# Patient Record
Sex: Male | Born: 1995 | Race: Black or African American | Hispanic: No | Marital: Single | Smoking: Current every day smoker
Health system: Southern US, Community
[De-identification: ages and names within clinical notes are randomized; demographics above are authoritative.]

## PROBLEM LIST (undated history)

## (undated) DIAGNOSIS — J45909 Unspecified asthma, uncomplicated: Secondary | ICD-10-CM

---

## 2019-06-30 ENCOUNTER — Encounter (HOSPITAL_COMMUNITY): Payer: Self-pay

## 2019-06-30 ENCOUNTER — Ambulatory Visit (HOSPITAL_COMMUNITY)
Admission: EM | Admit: 2019-06-30 | Discharge: 2019-06-30 | Disposition: A | Discharge status: Home / Self Care | Payer: Self-pay | Attending: Emergency Medicine | Admitting: Emergency Medicine

## 2019-06-30 ENCOUNTER — Other Ambulatory Visit: Payer: Self-pay

## 2019-06-30 DIAGNOSIS — Z202 Contact with and (suspected) exposure to infections with a predominantly sexual mode of transmission: Secondary | ICD-10-CM | POA: Insufficient documentation

## 2019-06-30 MED ORDER — AZITHROMYCIN 250 MG PO TABS
1000.0000 mg | ORAL_TABLET | Freq: Once | ORAL | Status: AC
  Administered 2019-06-30: 18:00:00 1000 mg via ORAL

## 2019-06-30 MED ORDER — CEFTRIAXONE SODIUM 250 MG IJ SOLR
250.0000 mg | Freq: Once | INTRAMUSCULAR | Status: AC
  Administered 2019-06-30: 18:00:00 250 mg via INTRAMUSCULAR

## 2019-06-30 MED ORDER — LIDOCAINE HCL (PF) 1 % IJ SOLN
INTRAMUSCULAR | Status: AC
  Filled 2019-06-30: qty 2

## 2019-06-30 MED ORDER — CEFTRIAXONE SODIUM 250 MG IJ SOLR
INTRAMUSCULAR | Status: AC
  Filled 2019-06-30: qty 250

## 2019-06-30 MED ORDER — AZITHROMYCIN 250 MG PO TABS
ORAL_TABLET | ORAL | Status: AC
  Filled 2019-06-30: qty 4

## 2019-06-30 NOTE — Discharge Instructions (Addendum)
You were treated today with 2 antibiotics, Rocephin and Zithromax.    Do not have sex for 7 days.    Your STD tests are pending.  If your test results are positive, we will call you.  You may need additional treatment and your partner may also need treatment.

## 2019-06-30 NOTE — ED Provider Notes (Signed)
[facility]    CSN: [id number] Arrival date & time: 06/30/19  1538      History   Chief Complaint Chief Complaint  Patient presents with  . Exposure to STD    HPI Sandi RavelingZion Riggio is a 23 y.o. male.   Patient presents with request for STD testing and treatment.  He states he recently found out that his partner tested positive for gonorrhea.  He denies penile discharge, testicular pain, abdominal pain, dysuria, fever, rash, or other symptoms.  The history is provided by the patient.    History reviewed. No pertinent past medical history.  There are no active problems to display for this patient.   History reviewed. No pertinent surgical history.     Home Medications    Prior to Admission medications   Not on File    Family History History reviewed. No pertinent family history.  Social History Social History   Tobacco Use  . Smoking status: Current Every Day Smoker  . Smokeless tobacco: Current User  Substance Use Topics  . Alcohol use: Not on file  . Drug use: Not on file     Allergies   Patient has no known allergies.   Review of Systems Review of Systems  Constitutional: Negative for chills and fever.  HENT: Negative for ear pain and sore throat.   Eyes: Negative for pain and visual disturbance.  Respiratory: Negative for cough and shortness of breath.   Cardiovascular: Negative for chest pain and palpitations.  Gastrointestinal: Negative for abdominal pain and vomiting.  Genitourinary: Negative for discharge, dysuria, flank pain, hematuria and testicular pain.  Musculoskeletal: Negative for arthralgias and back pain.  Skin: Negative for color change and rash.  Neurological: Negative for seizures and syncope.  All other systems reviewed and are negative.    Physical Exam Triage Vital Signs ED Triage Vitals [06/30/19 1643]  Enc Vitals Group     BP (!) 129/93     Pulse Rate 71     Resp 16     Temp 98.5 F (36.9 C)     Temp Source  Oral     SpO2 99 %     Weight      Height      Head Circumference      Peak Flow      Pain Score 0     Pain Loc      Pain Edu?      Excl. in GC?    No data found.  Updated Vital Signs BP (!) 129/93 (BP Location: Left Arm)   Pulse 71   Temp 98.5 F (36.9 C) (Oral)   Resp 16   SpO2 99%   Visual Acuity Right Eye Distance:   Left Eye Distance:   Bilateral Distance:    Right Eye Near:   Left Eye Near:    Bilateral Near:     Physical Exam Vitals signs and nursing note reviewed.  Constitutional:      Appearance: He is well-developed.  HENT:     Head: Normocephalic and atraumatic.  Eyes:     Conjunctiva/sclera: Conjunctivae normal.  Neck:     Musculoskeletal: Neck supple.  Cardiovascular:     Rate and Rhythm: Normal rate and regular rhythm.     Heart sounds: No murmur.  Pulmonary:     Effort: Pulmonary effort is normal. No respiratory distress.     Breath sounds: Normal breath sounds.  Abdominal:     Palpations: Abdomen is soft.  Tenderness: There is no abdominal tenderness. There is no right CVA tenderness, left CVA tenderness, guarding or rebound.  Genitourinary:    Penis: Normal.      Scrotum/Testes: Normal.  Skin:    General: Skin is warm and dry.     Findings: No lesion or rash.  Neurological:     Mental Status: He is alert.      UC Treatments / Results  Labs (all labs ordered are listed, but only abnormal results are displayed) Labs Reviewed  RPR  HIV ANTIBODY (ROUTINE TESTING W REFLEX)  CYTOLOGY, (ORAL, ANAL, URETHRAL) ANCILLARY ONLY    EKG   Radiology No results found.  Procedures Procedures (including critical care time)  Medications Ordered in UC Medications  azithromycin (ZITHROMAX) tablet 1,000 mg (has no administration in time range)  cefTRIAXone (ROCEPHIN) injection 250 mg (has no administration in time range)    Initial Impression / Assessment and Plan / UC Course  I have reviewed the triage vital signs and the nursing  notes.  Pertinent labs & imaging results that were available during my care of the patient were reviewed by me and considered in my medical decision making (see chart for details).    Possible exposure to STD.  Patient is asymptomatic at this time.  Urethral swab for STDs pending; HIV and RPR pending.  Treated with Rocephin and Zithromax.  Instructed patient to abstain from sex for 7 days.  Discussed with patient that we will call him if his test results are positive.  Discussed that he may need additional treatment at that time and his partner may also.  Patient agrees with plan of care.     Final Clinical Impressions(s) / UC Diagnoses   Final diagnoses:  Possible exposure to STD     Discharge Instructions     You were treated today with 2 antibiotics, Rocephin and Zithromax.    Do not have sex for 7 days.    Your STD tests are pending.  If your test results are positive, we will call you.  You may need additional treatment and your partner may also need treatment.           ED Prescriptions    None     Controlled Substance Prescriptions [place] Controlled Substance Registry consulted? Not Applicable   Sharion Balloon, NP 06/30/19 1710

## 2019-06-30 NOTE — ED Triage Notes (Addendum)
Pt present he has been exposed to an STD, pt partner recently informed him that he tested positive for Gonorrhea. Pt would like to get a full panel of STD  Screening.  Pt denies any symptoms of a STD

## 2019-07-01 LAB — RPR: RPR Ser Ql: NONREACTIVE

## 2019-07-02 LAB — CYTOLOGY, (ORAL, ANAL, URETHRAL) ANCILLARY ONLY
Chlamydia: NEGATIVE
Neisseria Gonorrhea: NEGATIVE
Trichomonas: NEGATIVE

## 2019-07-02 LAB — HIV ANTIBODY (ROUTINE TESTING W REFLEX): HIV Screen 4th Generation wRfx: NONREACTIVE

## 2020-04-21 ENCOUNTER — Other Ambulatory Visit: Payer: Self-pay

## 2020-04-21 ENCOUNTER — Encounter: Payer: Self-pay | Admitting: Emergency Medicine

## 2020-04-21 ENCOUNTER — Ambulatory Visit
Admission: EM | Admit: 2020-04-21 | Discharge: 2020-04-21 | Disposition: A | Discharge status: Home / Self Care | Payer: Self-pay

## 2020-04-21 DIAGNOSIS — S01512A Laceration without foreign body of oral cavity, initial encounter: Secondary | ICD-10-CM

## 2020-04-21 HISTORY — DX: Unspecified asthma, uncomplicated: J45.909

## 2020-04-21 MED ORDER — CHLORHEXIDINE GLUCONATE 0.12 % MT SOLN: 15.0000 mL | Freq: Two times a day (BID) | OROMUCOSAL | 0 refills | Status: AC

## 2020-04-21 NOTE — Discharge Instructions (Signed)
No alarming signs on exam. Peridex as directed. Avoid further injury to the area. Ice compress. Ibuprofen 600-800mg  three times a day for pain and swelling. Follow up with PCP/ENT for further evaluation if symptoms not improving.
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
## 2020-04-21 NOTE — ED Notes (Signed)
Patient able to ambulate independently  

## 2020-07-07 ENCOUNTER — Encounter (HOSPITAL_COMMUNITY): Payer: Self-pay | Admitting: Obstetrics and Gynecology

## 2020-07-07 ENCOUNTER — Other Ambulatory Visit: Payer: Self-pay

## 2020-07-07 ENCOUNTER — Emergency Department (HOSPITAL_COMMUNITY)
Admission: EM | Admit: 2020-07-07 | Discharge: 2020-07-07 | Disposition: A | Discharge status: Home / Self Care | Payer: HRSA Program | Attending: Emergency Medicine | Admitting: Emergency Medicine

## 2020-07-07 DIAGNOSIS — Z79899 Other long term (current) drug therapy: Secondary | ICD-10-CM | POA: Diagnosis not present

## 2020-07-07 DIAGNOSIS — F17210 Nicotine dependence, cigarettes, uncomplicated: Secondary | ICD-10-CM | POA: Insufficient documentation

## 2020-07-07 DIAGNOSIS — U071 COVID-19: Secondary | ICD-10-CM | POA: Insufficient documentation

## 2020-07-07 DIAGNOSIS — J45909 Unspecified asthma, uncomplicated: Secondary | ICD-10-CM | POA: Diagnosis not present

## 2020-07-07 DIAGNOSIS — R05 Cough: Secondary | ICD-10-CM | POA: Diagnosis present

## 2020-07-07 LAB — SARS CORONAVIRUS 2 BY RT PCR (HOSPITAL ORDER, PERFORMED IN ~~LOC~~ HOSPITAL LAB): SARS Coronavirus 2: POSITIVE — AB

## 2020-07-07 MED ORDER — ALBUTEROL SULFATE HFA 108 (90 BASE) MCG/ACT IN AERS
2.0000 | INHALATION_SPRAY | RESPIRATORY_TRACT | Status: DC | PRN
  Administered 2020-07-07: 2 via RESPIRATORY_TRACT
  Filled 2020-07-07: qty 6.7

## 2020-07-07 NOTE — ED Notes (Signed)
RN attempted to call patient x3 via phone number left and was unable to reach patient. Patient discharged from system

## 2020-07-07 NOTE — ED Provider Notes (Signed)
[facility]ERGENCY DEPT Provider Note   CSN: [id number] Arrival date & time: 07/07/20  1851     History Chief Complaint  Patient presents with  . Covid Exposure    Gabriel Campbell is a 24 y.o. male.  HPI Patient has history of asthma.  States he is not on any medications.  Was brought in for upper respiratory type symptoms.  Coughing.  Some chills.  Has had symptoms for around 6 days.  States he has a roommate that has Covid.  Patient did not get the vaccines.  Has not lost sense of taste.  Some fatigue but otherwise able to do his normal daily activities.    Past Medical History:  Diagnosis Date  . Asthma     There are no problems to display for this patient.   No past surgical history on file.     Family History  Problem Relation Age of Onset  . Hypertension Mother     Social History   Tobacco Use  . Smoking status: Current Every Day Smoker    Packs/day: 0.25    Years: 2.00    Pack years: 0.50    Types: Cigarettes  . Smokeless tobacco: Never Used  Substance Use Topics  . Alcohol use: Not Currently  . Drug use: Yes    Types: Marijuana    Home Medications Prior to Admission medications   Medication Sig Start Date End Date Taking? Authorizing Provider  albuterol (VENTOLIN HFA) 108 (90 Base) MCG/ACT inhaler Inhale into the lungs every 6 (six) hours as needed for wheezing or shortness of breath.    [provider]  chlorhexidine (PERIDEX) 0.12 % solution Use as directed 15 mLs in the mouth or throat 2 (two) times daily. 04/21/20   Belinda Fisher, PA-C    Allergies    Patient has no known allergies.  Review of Systems   Review of Systems  Constitutional: Positive for fatigue and fever. Negative for appetite change.  HENT: Negative for congestion.   Respiratory: Positive for cough and shortness of breath.   Cardiovascular: Negative for chest pain.  Gastrointestinal: Negative for abdominal pain.  Genitourinary: Negative for flank  pain.  Musculoskeletal: Negative for back pain.  Skin: Negative for rash.  Neurological: Negative for weakness.  Psychiatric/Behavioral: Negative for confusion.    Physical Exam Updated Vital Signs BP 115/74 (BP Location: Left Arm)   Pulse 70   Temp 98.9 F (37.2 C) (Oral)   Resp 18   Ht 6' (1.829 m)   Wt 68 kg   SpO2 99%   BMI 20.34 kg/m   Physical Exam Vitals and nursing note reviewed.  HENT:     Head: Normocephalic.  Eyes:     Pupils: Pupils are equal, round, and reactive to light.  Cardiovascular:     Rate and Rhythm: Normal rate and regular rhythm.  Pulmonary:     Comments: Mildly harsh breath sounds without focal rales or rhonchi. Abdominal:     Tenderness: There is no abdominal tenderness.  Musculoskeletal:        General: No tenderness.     Cervical back: Neck supple.     Right lower leg: No edema.     Left lower leg: No edema.  Skin:    General: Skin is warm.     Capillary Refill: Capillary refill takes less than 2 seconds.  Neurological:     Mental Status: He is alert and oriented to person, place, and time.  Motor: No weakness.     ED Results / Procedures / Treatments   Labs (all labs ordered are listed, but only abnormal results are displayed) Labs Reviewed  SARS CORONAVIRUS 2 BY RT PCR (HOSPITAL ORDER, PERFORMED IN [facility]) - Abnormal; Notable for the following components:      Result Value   SARS Coronavirus 2 POSITIVE (*)    All other components within normal limits    EKG None  Radiology No results found.  Procedures Procedures (including critical care time)  Medications Ordered in ED Medications  albuterol (VENTOLIN HFA) 108 (90 Base) MCG/ACT inhaler 2 puff (2 puffs Inhalation Given 07/07/20 2252)    ED Course  I have reviewed the triage vital signs and the nursing notes.  Pertinent labs & imaging results that were available during my care of the patient were reviewed by me and considered in my medical  decision making (see chart for details).    MDM Rules/Calculators/A&P                          Patient with URI symptoms.  Well-appearing.  Positive Covid test.  Not hypoxic.  Does have history of asthma.  Given inhaler here.  Can follow-up with the Covid clinic for any further needs.  Potentially candidate for monoclonal antibodies with history of asthma. Final Clinical Impression(s) / ED Diagnoses Final diagnoses:  COVID-19 virus infection    Rx / DC Orders ED Discharge Orders    None       Benjiman Core, MD 07/07/20 2314

## 2020-07-07 NOTE — ED Triage Notes (Signed)
Patient reports to the ER for COVID exposure

## 2020-07-08 ENCOUNTER — Telehealth (HOSPITAL_COMMUNITY): Payer: Self-pay | Admitting: Adult Health

## 2020-07-08 NOTE — Telephone Encounter (Signed)
Called and LMOM regarding monoclonal antibody treatment for COVID 19 given to those who are at risk for complications and/or hospitalization of the virus.  Patient meets criteria based on: social risk  Call back number given: [phone]  My chart message: sent Text message: sent  Lillard Anes, NP

## 2021-08-23 ENCOUNTER — Encounter (HOSPITAL_COMMUNITY): Payer: Self-pay | Admitting: Emergency Medicine

## 2021-08-23 ENCOUNTER — Other Ambulatory Visit: Payer: Self-pay

## 2021-08-23 ENCOUNTER — Ambulatory Visit (HOSPITAL_COMMUNITY)
Admission: EM | Admit: 2021-08-23 | Discharge: 2021-08-23 | Disposition: A | Discharge status: Home / Self Care | Payer: Self-pay | Attending: Family Medicine | Admitting: Family Medicine

## 2021-08-23 DIAGNOSIS — M546 Pain in thoracic spine: Secondary | ICD-10-CM

## 2021-08-23 DIAGNOSIS — S161XXA Strain of muscle, fascia and tendon at neck level, initial encounter: Secondary | ICD-10-CM

## 2021-08-23 MED ORDER — CYCLOBENZAPRINE HCL 10 MG PO TABS: 10.0000 mg | ORAL_TABLET | Freq: Three times a day (TID) | ORAL | 0 refills | Status: AC | PRN

## 2021-08-23 MED ORDER — NAPROXEN 500 MG PO TABS: 500.0000 mg | ORAL_TABLET | Freq: Two times a day (BID) | ORAL | 0 refills | Status: AC
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
## 2021-12-29 ENCOUNTER — Other Ambulatory Visit (HOSPITAL_COMMUNITY): Payer: Self-pay

## 2021-12-29 ENCOUNTER — Other Ambulatory Visit: Payer: Self-pay

## 2021-12-29 ENCOUNTER — Ambulatory Visit: Payer: Self-pay | Admitting: Pharmacist

## 2022-01-25 ENCOUNTER — Encounter (HOSPITAL_COMMUNITY): Payer: Self-pay | Admitting: Emergency Medicine

## 2022-01-25 ENCOUNTER — Emergency Department (HOSPITAL_COMMUNITY): Payer: Self-pay

## 2022-01-25 ENCOUNTER — Emergency Department (HOSPITAL_COMMUNITY)
Admission: EM | Admit: 2022-01-25 | Discharge: 2022-01-25 | Disposition: A | Discharge status: Home / Self Care | Payer: Self-pay | Attending: Emergency Medicine | Admitting: Emergency Medicine

## 2022-01-25 DIAGNOSIS — S60511A Abrasion of right hand, initial encounter: Secondary | ICD-10-CM | POA: Insufficient documentation

## 2022-01-25 DIAGNOSIS — Z23 Encounter for immunization: Secondary | ICD-10-CM | POA: Insufficient documentation

## 2022-01-25 DIAGNOSIS — S20212A Contusion of left front wall of thorax, initial encounter: Secondary | ICD-10-CM | POA: Insufficient documentation

## 2022-01-25 DIAGNOSIS — S01511A Laceration without foreign body of lip, initial encounter: Secondary | ICD-10-CM | POA: Insufficient documentation

## 2022-01-25 DIAGNOSIS — S60512A Abrasion of left hand, initial encounter: Secondary | ICD-10-CM | POA: Insufficient documentation

## 2022-01-25 DIAGNOSIS — S60519A Abrasion of unspecified hand, initial encounter: Secondary | ICD-10-CM

## 2022-01-25 MED ORDER — OXYCODONE-ACETAMINOPHEN 5-325 MG PO TABS
1.0000 | ORAL_TABLET | Freq: Once | ORAL | Status: AC
  Administered 2022-01-25: 1 via ORAL
  Filled 2022-01-25: qty 1

## 2022-01-25 MED ORDER — TETANUS-DIPHTH-ACELL PERTUSSIS 5-2.5-18.5 LF-MCG/0.5 IM SUSY
0.5000 mL | PREFILLED_SYRINGE | Freq: Once | INTRAMUSCULAR | Status: AC
  Administered 2022-01-25: 0.5 mL via INTRAMUSCULAR
  Filled 2022-01-25: qty 0.5

## 2022-01-25 MED ORDER — LIDOCAINE-EPINEPHRINE (PF) 2 %-1:200000 IJ SOLN
2.0000 mL | Freq: Once | INTRAMUSCULAR | Status: AC
  Administered 2022-01-25: 2 mL
  Filled 2022-01-25: qty 20

## 2022-01-25 NOTE — ED Notes (Signed)
ED Provider at bedside. 
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
## 2022-01-25 NOTE — ED Provider Notes (Signed)
?[facility] DEPT ?Provider Note ? ? ?CSN: [id number] ?Arrival date & time: 01/25/22  1332 ? ?  ? ?History ? ?Chief Complaint  ?Patient presents with  ? Rib Injury  ? ? ?Gabriel Campbell is a 26 y.o. male. ? ?Patient arrives in custody of [employer]. Patient reportedly resisting arrest, ended up being taken to the ground and landing on left lateral ribs. Patient complaining of pain, with tenderness of the chest wall. No crepitus noted. Lungs CTA bilaterally. ? ?The history is provided by the patient. No language interpreter was used.  ?Chest Pain ?Pain location:  L lateral chest ?Pain quality: sharp and stabbing   ?Pain radiates to:  Does not radiate ?Pain severity:  Severe ?Onset quality:  Sudden ?Chronicity:  New ?Context: trauma   ?Ineffective treatments:  None tried ?Associated symptoms: no shortness of breath   ? ?  ? ?Home Medications ?Prior to Admission medications   ?Medication Sig Start Date End Date Taking? Authorizing Provider  ?albuterol (VENTOLIN HFA) 108 (90 Base) MCG/ACT inhaler Inhale into the lungs every 6 (six) hours as needed for wheezing or shortness of breath.    [provider]  ?chlorhexidine (PERIDEX) 0.12 % solution Use as directed 15 mLs in the mouth or throat 2 (two) times daily. 04/21/20   Tasia Catchings, Amy V, PA-C  ?cyclobenzaprine (FLEXERIL) 10 MG tablet Take 1 tablet (10 mg total) by mouth 3 (three) times daily as needed for muscle spasms. Do not drink alcohol or drive while taking this medication.  May cause drowsiness. 08/23/21   Volney American, PA-C  ?naproxen (NAPROSYN) 500 MG tablet Take 1 tablet (500 mg total) by mouth 2 (two) times daily. 08/23/21   Volney American, PA-C  ?   ? ?Allergies    ?Patient has no known allergies.   ? ?Review of Systems   ?Review of Systems  ?Respiratory:  Negative for shortness of breath.   ?Cardiovascular:  Positive for chest pain.  ?All other systems reviewed and are negative. ? ?Physical Exam ?Updated Vital Signs ?BP 121/79  (BP Location: Right Arm)   Pulse (!) 102   Temp 98 ?F (36.7 ?C) (Oral)   Resp 16   SpO2 94%  ?Physical Exam ?Vitals and nursing note reviewed.  ?Constitutional:   ?   Appearance: Normal appearance.  ?HENT:  ?   Head: Atraumatic.  ?   Nose: Nose normal.  ?   Mouth/Throat:  ? ?   Comments: Small laceration below  left lateral aspect of lip ?Eyes:  ?   Conjunctiva/sclera: Conjunctivae normal.  ?Cardiovascular:  ?   Rate and Rhythm: Normal rate and regular rhythm.  ?Pulmonary:  ?   Effort: Pulmonary effort is normal.  ?   Breath sounds: Normal breath sounds.  ?Chest:  ?   Chest wall: Tenderness present.  ?Abdominal:  ?   Palpations: Abdomen is soft.  ?Musculoskeletal:     ?   General: Normal range of motion.  ?   Cervical back: Normal range of motion.  ?Skin: ?   General: Skin is warm and dry.  ?   Comments: Superficial abrasions to palms of both hands  ?Neurological:  ?   Mental Status: He is alert and oriented to person, place, and time.  ?Psychiatric:     ?   Attention and Perception: Attention normal.     ?   Mood and Affect: Affect is angry.     ?   Speech: Speech normal.     ?  Behavior: Behavior is cooperative.  ? ? ?ED Results / Procedures / Treatments   ?Labs ?(all labs ordered are listed, but only abnormal results are displayed) ?Labs Reviewed - No data to display ? ?EKG ?None ? ?Radiology ?DG Ribs Unilateral W/Chest Left ? ?Result Date: 01/25/2022 ?CLINICAL DATA:  Injury.  Left chest wall pain EXAM: LEFT RIBS AND CHEST - 3+ VIEW COMPARISON:  None. FINDINGS: No fracture or other bone lesions are seen involving the ribs. There is no evidence of pneumothorax or pleural effusion. Both lungs are clear. Heart size and mediastinal contours are within normal limits. IMPRESSION: Negative. Electronically Signed   By: Franchot Gallo M.D.   On: 01/25/2022 14:44   ? ?Procedures ?Marland Kitchen.Laceration Repair ? ?Date/Time: 01/25/2022 3:24 PM ?Performed by: Etta Quill, NP ?Authorized by: Etta Quill, NP  ? ?Consent:  ?  Consent  obtained:  Verbal ?  Risks discussed:  Infection, pain and poor cosmetic result ?Universal protocol:  ?  Procedure explained and questions answered to patient or proxy's satisfaction: yes   ?  Patient identity confirmed:  Verbally with patient ?Anesthesia:  ?  Anesthesia method:  Local infiltration ?Laceration details:  ?  Location:  Lip ?  Lip location:  Lower exterior lip ?  Length (cm):  1 ?Pre-procedure details:  ?  Preparation:  Patient was prepped and draped in usual sterile fashion ?Exploration:  ?  Hemostasis achieved with:  Direct pressure ?  Contaminated: no   ?Treatment:  ?  Area cleansed with:  Povidone-iodine and saline ?  Amount of cleaning:  Standard ?  Irrigation method:  Syringe ?  Debridement:  None ?  Undermining:  None ?Skin repair:  ?  Repair method:  Sutures ?  Suture size:  5-0 ?  Suture technique:  Simple interrupted ?  Number of sutures:  2 ?Repair type:  ?  Repair type:  Simple ?Post-procedure details:  ?  Dressing:  Open (no dressing) ?  Procedure completion:  Tolerated  ? ? ?Medications Ordered in ED ?Medications  ?oxyCODONE-acetaminophen (PERCOCET/ROXICET) 5-325 MG per tablet 1 tablet (has no administration in time range)  ? ? ?ED Course/ Medical Decision Making/ A&P ?  ?                        ?Medical Decision Making ?Amount and/or Complexity of Data Reviewed ?Radiology: ordered. ? ?Risk ?Prescription drug management. ? ? ?Tetanus updated in ED. Laceration occurred < 12 hours prior to repair. Discussed laceration care with pt and answered questions. Pt to f-u for suture removal in 5-7 days and wound check sooner should there be signs of dehiscence or infection. Pt is hemodynamically stable with no other complaints prior to dc.  ? ?Given workup and exam, no indication of intrathoracic or intra abdominal trauma. No rib fractures identified on chest film. Care instructions and return precautions provided. ?   ? ? ? ? ? ? ? ?Final Clinical Impression(s) / ED Diagnoses ?Final diagnoses:   ?Contusion of left chest wall, initial encounter  ?Laceration of lower lip, initial encounter  ?Abrasion of palm of hand, unspecified laterality, initial encounter  ? ? ?Rx / DC Orders ?ED Discharge Orders   ? ? None  ? ?  ? ? ?  ?Etta Quill, NP ?01/25/22 1615 ? ?  ?Lorelle Gibbs, DO ?01/26/22 [id number] ? ?

## 2022-01-25 NOTE — Discharge Instructions (Addendum)
Please refer to the attached instructions. Be sure to perform deep breathing exercises several times per day. Splint with a pillow as needed during the exercises. Tylenol or ibuprofen for discomfort. Keep all wounds clean and dry. Suture removal in 5-7 days. ?

## 2022-01-25 NOTE — ED Notes (Addendum)
Pt requests EDP to exam small laceration to L lower lip. ?

## 2022-01-25 NOTE — ED Triage Notes (Signed)
Per EMS-patient was apprehended by police and throne to ground-complaining left rib pain-needs xray before going to jail ?

## 2022-03-27 ENCOUNTER — Other Ambulatory Visit: Payer: Self-pay

## 2022-03-27 ENCOUNTER — Ambulatory Visit (HOSPITAL_COMMUNITY)
Admission: EM | Admit: 2022-03-27 | Discharge: 2022-03-28 | Disposition: A | Discharge status: Home / Self Care | Payer: No Payment, Other | Attending: Registered Nurse | Admitting: Registered Nurse

## 2022-03-27 DIAGNOSIS — R45851 Suicidal ideations: Secondary | ICD-10-CM | POA: Insufficient documentation

## 2022-03-27 DIAGNOSIS — Z560 Unemployment, unspecified: Secondary | ICD-10-CM | POA: Insufficient documentation

## 2022-03-27 DIAGNOSIS — F1290 Cannabis use, unspecified, uncomplicated: Secondary | ICD-10-CM | POA: Insufficient documentation

## 2022-03-27 DIAGNOSIS — J45909 Unspecified asthma, uncomplicated: Secondary | ICD-10-CM | POA: Insufficient documentation

## 2022-03-27 DIAGNOSIS — F4323 Adjustment disorder with mixed anxiety and depressed mood: Secondary | ICD-10-CM | POA: Insufficient documentation

## 2022-03-27 DIAGNOSIS — Z20822 Contact with and (suspected) exposure to covid-19: Secondary | ICD-10-CM | POA: Insufficient documentation

## 2022-03-27 DIAGNOSIS — F17210 Nicotine dependence, cigarettes, uncomplicated: Secondary | ICD-10-CM | POA: Insufficient documentation

## 2022-03-27 LAB — URINALYSIS, ROUTINE W REFLEX MICROSCOPIC
Bilirubin Urine: NEGATIVE
Glucose, UA: NEGATIVE mg/dL
Hgb urine dipstick: NEGATIVE
Ketones, ur: NEGATIVE mg/dL
Leukocytes,Ua: NEGATIVE
Nitrite: NEGATIVE
Protein, ur: NEGATIVE mg/dL
Specific Gravity, Urine: 1.029 (ref 1.005–1.030)
pH: 6 (ref 5.0–8.0)

## 2022-03-27 LAB — CBC WITH DIFFERENTIAL/PLATELET
Abs Immature Granulocytes: 0.09 10*3/uL — ABNORMAL HIGH (ref 0.00–0.07)
Basophils Absolute: 0 10*3/uL (ref 0.0–0.1)
Basophils Relative: 0 %
Eosinophils Absolute: 0.1 10*3/uL (ref 0.0–0.5)
Eosinophils Relative: 1 %
HCT: 55.4 % — ABNORMAL HIGH (ref 39.0–52.0)
Hemoglobin: 18.8 g/dL — ABNORMAL HIGH (ref 13.0–17.0)
Immature Granulocytes: 1 %
Lymphocytes Relative: 17 %
Lymphs Abs: 2.2 10*3/uL (ref 0.7–4.0)
MCH: 30.2 pg (ref 26.0–34.0)
MCHC: 33.9 g/dL (ref 30.0–36.0)
MCV: 88.9 fL (ref 80.0–100.0)
Monocytes Absolute: 0.9 10*3/uL (ref 0.1–1.0)
Monocytes Relative: 7 %
Neutro Abs: 9.5 10*3/uL — ABNORMAL HIGH (ref 1.7–7.7)
Neutrophils Relative %: 74 %
Platelets: 242 10*3/uL (ref 150–400)
RBC: 6.23 MIL/uL — ABNORMAL HIGH (ref 4.22–5.81)
RDW: 12.4 % (ref 11.5–15.5)
WBC: 12.8 10*3/uL — ABNORMAL HIGH (ref 4.0–10.5)
nRBC: 0 % (ref 0.0–0.2)

## 2022-03-27 LAB — POCT URINE DRUG SCREEN - MANUAL ENTRY (I-SCREEN)
POC Amphetamine UR: NOT DETECTED
POC Buprenorphine (BUP): NOT DETECTED
POC Cocaine UR: NOT DETECTED
POC Marijuana UR: POSITIVE — AB
POC Methadone UR: NOT DETECTED
POC Methamphetamine UR: NOT DETECTED
POC Morphine: NOT DETECTED
POC Oxazepam (BZO): NOT DETECTED
POC Oxycodone UR: NOT DETECTED
POC Secobarbital (BAR): NOT DETECTED

## 2022-03-27 LAB — RESP PANEL BY RT-PCR (FLU A&B, COVID) ARPGX2
Influenza A by PCR: NEGATIVE
Influenza B by PCR: NEGATIVE
SARS Coronavirus 2 by RT PCR: NEGATIVE

## 2022-03-27 LAB — COMPREHENSIVE METABOLIC PANEL
ALT: 26 U/L (ref 0–44)
AST: 27 U/L (ref 15–41)
Albumin: 4.5 g/dL (ref 3.5–5.0)
Alkaline Phosphatase: 53 U/L (ref 38–126)
Anion gap: 8 (ref 5–15)
BUN: 7 mg/dL (ref 6–20)
CO2: 28 mmol/L (ref 22–32)
Calcium: 9.8 mg/dL (ref 8.9–10.3)
Chloride: 104 mmol/L (ref 98–111)
Creatinine, Ser: 1.06 mg/dL (ref 0.61–1.24)
GFR, Estimated: >60 mL/min (ref >60)
Glucose, Bld: 87 mg/dL (ref 70–99)
Potassium: 4.2 mmol/L (ref 3.5–5.1)
Sodium: 140 mmol/L (ref 135–145)
Total Bilirubin: 0.9 mg/dL (ref 0.3–1.2)
Total Protein: 7.1 g/dL (ref 6.5–8.1)

## 2022-03-27 LAB — LIPID PANEL
Cholesterol: 157 mg/dL (ref 0–200)
HDL: 51 mg/dL (ref >40)
LDL Cholesterol: 76 mg/dL (ref 0–99)
Total CHOL/HDL Ratio: 3.1 RATIO
Triglycerides: 152 mg/dL — ABNORMAL HIGH (ref <150)
VLDL: 30 mg/dL (ref 0–40)

## 2022-03-27 LAB — POC SARS CORONAVIRUS 2 AG: SARSCOV2ONAVIRUS 2 AG: NEGATIVE

## 2022-03-27 LAB — MAGNESIUM: Magnesium: 2.1 mg/dL (ref 1.7–2.4)

## 2022-03-27 LAB — ETHANOL: Alcohol, Ethyl (B): <10 mg/dL (ref <10)

## 2022-03-27 LAB — TSH: TSH: 1.867 u[IU]/mL (ref 0.350–4.500)

## 2022-03-27 LAB — HEMOGLOBIN A1C
Hgb A1c MFr Bld: 5.5 % (ref 4.8–5.6)
Mean Plasma Glucose: 111.15 mg/dL

## 2022-03-27 MED ORDER — MAGNESIUM HYDROXIDE 400 MG/5ML PO SUSP: 30.0000 mL | Freq: Every day | ORAL | Status: DC | PRN

## 2022-03-27 MED ORDER — ALUM & MAG HYDROXIDE-SIMETH 200-200-20 MG/5ML PO SUSP: 30.0000 mL | ORAL | Status: DC | PRN

## 2022-03-27 MED ORDER — HYDROXYZINE HCL 25 MG PO TABS
25.0000 mg | ORAL_TABLET | Freq: Three times a day (TID) | ORAL | Status: DC | PRN
  Administered 2022-03-27: 25 mg via ORAL
  Filled 2022-03-27: qty 1

## 2022-03-27 MED ORDER — ACETAMINOPHEN 325 MG PO TABS: 650.0000 mg | ORAL_TABLET | Freq: Four times a day (QID) | ORAL | Status: DC | PRN

## 2022-03-27 MED ORDER — TRAZODONE HCL 50 MG PO TABS
50.0000 mg | ORAL_TABLET | Freq: Every evening | ORAL | Status: DC | PRN
  Administered 2022-03-27: 50 mg via ORAL
  Filled 2022-03-27: qty 1

## 2022-03-27 NOTE — Progress Notes (Signed)
   03/27/22 1830  [facility] Triage Screening (Walk-ins at [facility] only)  How Did You Hear About [place]? Legal System  What Is the Reason for Your Visit/Call Today? Patient presents voluntarily via [employer] after getting into an argument with his boyfriend and making concerning statements.  He also wrote a note to boyfriend that included statements about wanting to kill himself.  Patient denies SI, stating he had passive thoughts and no plan.  He denies hx of attempts and states he was just feeling "overwhelmed with life, work, relationship problems" etc.  He denies any psychiatric history and is able to affirm his safety.  He gives verbal consent for provider to contact boyfriend and mother to confirm his report and engage in safety planning/discharge planning.  Patient is interested in pursuing outpatient therapy and would like referral information.  How Long Has This Been Causing You Problems? 1-6 months  Have You Recently Had Any Thoughts About Hurting Yourself? Yes  How long ago did you have thoughts about hurting yourself? Passive SI, made statements "in heat of argument," however adamantly denies SI, plan, intent or hx of attempts. No self-harm hx and no access to firearms.  Are You Planning to Commit Suicide/Harm Yourself At This time? No  Have you Recently Had Thoughts About [place]? No  Are You Planning To Harm Someone At This Time? No  Are you currently experiencing any auditory, visual or other hallucinations? No  Have You Used Any Alcohol or Drugs in the Past 24 Hours? No  Do you have any current medical co-morbidities that require immediate attention? No  Clinician description of patient physical appearance/behavior: Patient is calm, cooperative, pleasant AAOx5.  What Do You Feel Would Help You the Most Today? Treatment for Depression or other mood problem  If access to [facility] was not available, would you have sought care in the Emergency Department? No  Determination of Need Routine  (7 days)  Options For Referral Outpatient Therapy;Medication Management

## 2022-03-27 NOTE — Discharge Instructions (Addendum)
For additional medication management and therapy resources, please consider calling the back of your insurance card and requesting resources.

## 2022-03-27 NOTE — ED Provider Notes (Signed)
Behavioral Health Urgent Care Medical Screening Exam  Patient Name: Gabriel Campbell MRN: [id number] Date of Evaluation: 03/27/22 Chief Complaint:   Diagnosis:  Final diagnoses:  Adjustment disorder with mixed anxiety and depressed mood  Passive suicidal ideations    History of Present illness: Gabriel Campbell is a 26 y.o. male patient presented to [facility] as a walk in via [place] [employer] with complaints of passive suicidal ideation  Gabriel Campbell, 26 y.o., male patient seen face to face by this provider, consulted with Dr. Earlene Plater; and chart reviewed on 03/27/22.  On evaluation Gabriel Campbell reports he was brought in by the police.  States he wrote a letter to his boyfriend today "telling him I wanted to kill myself."  Reports no prior suicide attempt or self harm.  States he has been under a lot of stress just "worrying about everything.  Relationship, unemployment, just everything, life."  Patient states he had left walking to cool down when the police picked him up.  States that he is no longer having any suicidal thoughts and the thoughts were more massive "Nobody's gonna miss me if I'm gone or not caring if I don't wake up.  I don't want to kill myself.  I've had a chance to calm down and I'm feeling better now."  Patient denies prior psychiatric hospitalization.  States he has never had outpatient psychiatric service or took psychotropic medications.  Patient states that he is currently living with his sister and receiving unemployment.  Reports that his primary support person is his boyfriend and gave permission to speak to his boyfriend or sister:  Boyfriend Gabriel Campbell at [phone] and sister Gabriel Campbell at [phone] During evaluation Gabriel Campbell is sitting upright in chair with no distress noted.  He is alert/oriented x 4; calm/cooperative; and mood congruent with affect.  He is speaking in a clear tone at moderate volume, and normal pace; with good eye contact.  His thought process  is coherent and relevant; There is no indication that he is currently responding to internal/external stimuli or experiencing delusional thought content.  He continues to deny suicidal/self-harm/homicidal ideation, psychosis, and paranoia.   Patient has remained calm throughout assessment and has answered questions appropriately.    Collateral Information.  No answer with boyfriend.  Patient sister states that in the letter that patient "Never insinuated that he wanted to kill himself" but he did write vague statements of wanting to die.  States that her brother will be living with her.  States that she is not sure of why he wrote the letter and is aware that he has no psychiatric history, never tried to kill himself and never any outpatient or inpatient psychiatric treatment.  Wanted to know if patient could stay overnight for observation.  Informed since she said the patient was safe that he would be discharge; "But I just want him to stay overnight to be sure" and asked if her brother could call her to agree to stay overnight.     Psychiatric Specialty Exam  Presentation  General Appearance:Appropriate for Environment  Eye Contact:Good  Speech:Clear and Coherent; Normal Rate  Speech Volume:Normal  Handedness:Right   Mood and Affect  Mood:Anxious  Affect:Congruent; Depressed   Thought Process  Thought Processes:Coherent; Goal Directed  Descriptions of Associations:Intact  Orientation:Full (Time, Place and Person)  Thought Content:Logical    Hallucinations:None  Ideas of Reference:None  Suicidal Thoughts:Yes, Passive (States he wrote a letter to boyfriend that wanted to kill self but has had a chance  to calm down and doesn't want to die.  Reports thoughts were more passive than active) Without Intent; Without Plan  Homicidal Thoughts:No   Sensorium  Memory:No data recorded Judgment:Intact  Insight:Present   Executive Functions  Concentration:Good  Attention  Span:Good  Recall:Good  Fund of Knowledge:Good  Language:Good   Psychomotor Activity  Psychomotor Activity:No data recorded  Assets  Assets:Communication Skills; Desire for Improvement; Financial Resources/Insurance; Housing; Leisure Time; Physical Health; Resilience; Social Support   Sleep  Sleep:Good  Number of hours: No data recorded  Nutritional Assessment (For OBS and FBC admissions only) Has the patient had a weight loss or gain of 10 pounds or more in the last 3 months?: No Has the patient had a decrease in food intake/or appetite?: No Does the patient have dental problems?: No Does the patient have eating habits or behaviors that may be indicators of an eating disorder including binging or inducing vomiting?: No Has the patient recently lost weight without trying?: 0 Has the patient been eating poorly because of a decreased appetite?: 0 Malnutrition Screening Tool Score: 0    Physical Exam: Physical Exam Vitals and nursing note reviewed. Exam conducted with a chaperone present.  Constitutional:      General: He is not in acute distress.    Appearance: Normal appearance. He is not ill-appearing.  Cardiovascular:     Rate and Rhythm: Normal rate.  Pulmonary:     Effort: Pulmonary effort is normal.  Musculoskeletal:        General: Normal range of motion.     Cervical back: Normal range of motion.  Skin:    General: Skin is warm and dry.  Neurological:     Mental Status: He is alert and oriented to person, place, and time.  Psychiatric:        Attention and Perception: Attention and perception normal. He does not perceive auditory or visual hallucinations.        Mood and Affect: Affect normal. Mood is anxious.        Speech: Speech normal.        Behavior: Behavior normal. Behavior is cooperative.        Thought Content: Thought content is not paranoid or delusional. Thought content does not include homicidal ideation. Suicidal: Reported passive thoughts  but denies at this time.       Cognition and Memory: Cognition and memory normal.        Judgment: Judgment normal.   Review of Systems  Constitutional: Negative.   HENT: Negative.    Eyes: Negative.   Respiratory: Negative.    Cardiovascular: Negative.   Gastrointestinal: Negative.   Genitourinary: Negative.   Musculoskeletal: Negative.   Skin: Negative.   Neurological: Negative.   Endo/Heme/Allergies: Negative.   Psychiatric/Behavioral:  Negative for hallucinations and substance abuse. Depression: Stable. Suicidal ideas: Denies at this time.  States earlier today was having passive thoughts and wrote a letter to his boyfriend.The patient is nervous/anxious (Just worrying about a lot of stuff). The patient does not have insomnia.   Blood pressure 119/84, pulse 87, temperature 99.4 F (37.4 C), temperature source Oral, resp. rate 18, SpO2 100 %. There is no height or weight on file to calculate BMI.  Musculoskeletal: Strength & Muscle Tone: within normal limits Gait & Station: normal Patient leans: N/A   [facility] MSE Discharge Disposition for Follow up and Recommendations: Based on my evaluation the patient does not appear to have an emergency medical condition and can be discharged with resources and  follow up care in outpatient services for Medication Management, Individual Therapy, and Group Therapy  Patient is willing to stay overnight.  Did not admit to continuous assessment related to patient willing to stay since sister just wants to stay overnight.  Will hold in continuous assessment.     Gabriel Christiano, NP 03/27/2022, 6:49 PM

## 2022-03-27 NOTE — ED Provider Notes (Signed)
[facility]gent Care Continuous Assessment Admission H&P  Date: 03/27/22 Patient Name: Gabriel Campbell MRN: [id number] Chief Complaint: No chief complaint on file.     Diagnoses:  Final diagnoses:  Adjustment disorder with mixed anxiety and depressed mood  Passive suicidal ideations    HPI: History of Present illness: Gabriel Campbell is a 26 y.o. male patient presented to [facility] as a walk in via [employer] with complaints of passive suicidal ideation   Gabriel Campbell, 26 y.o., male patient seen face to face by this provider, consulted with Dr. Earlene Plater; and chart reviewed on 03/27/22.  On evaluation Gabriel Campbell reports he was brought in by the police.  States he wrote a letter to his boyfriend today "telling him I wanted to kill myself."  Reports no prior suicide attempt or self harm.  States he has been under a lot of stress just "worrying about everything.  Relationship, unemployment, just everything, life."  Patient states he had left walking to cool down when the police picked him up.  States that he is no longer having any suicidal thoughts and the thoughts were more massive "Nobody's gonna miss me if I'm gone or not caring if I don't wake up.  I don't want to kill myself.  I've had a chance to calm down and I'm feeling better now."  Patient denies prior psychiatric hospitalization.  States he has never had outpatient psychiatric service or took psychotropic medications.  Patient states that he is currently living with his sister and receiving unemployment.  Reports that his primary support person is his boyfriend and gave permission to speak to his boyfriend or sister:  Boyfriend Gabriel Campbell at [phone] and sister Gabriel Campbell at [phone] During evaluation Gabriel Campbell is sitting upright in chair with no distress noted.  He is alert/oriented x 4; calm/cooperative; and mood congruent with affect.  He is speaking in a clear tone at moderate volume, and normal pace; with good eye  contact.  His thought process is coherent and relevant; There is no indication that he is currently responding to internal/external stimuli or experiencing delusional thought content.  He continues to deny suicidal/self-harm/homicidal ideation, psychosis, and paranoia.   Patient has remained calm throughout assessment and has answered questions appropriately.     Collateral Information.  No answer with boyfriend.  Patient sister states that in the letter that patient "Never insinuated that he wanted to kill himself" but he did write vague statements of wanting to die.  States that her brother will be living with her.  States that she is not sure of why he wrote the letter and is aware that he has no psychiatric history, never tried to kill himself and never any outpatient or inpatient psychiatric treatment.  Wanted to know if patient could stay overnight for observation.  Informed since she said the patient was safe that he would be discharge; "But I just want him to stay overnight to be sure" and asked if her brother could call her to agree to stay overnight.    PHQ 2-9:  Flowsheet Row ED from 03/27/2022 in [place]  Thoughts that you would be better off dead, or of hurting yourself in some way Several days  PHQ-9 Total Score 4       Flowsheet Row ED from 03/27/2022 in [facility] ED from 01/25/2022 in [facility] from 08/23/2021 in [facility] RISK CATEGORY Error:  Q3, 4, or 5 should not be populated when Q2 is No No Risk No Risk        Total Time spent with patient: 30 minutes  Musculoskeletal  Strength & Muscle Tone: within normal limits Gait & Station: normal Patient leans: N/A  Psychiatric Specialty Exam  Presentation General Appearance: Appropriate for Environment  Eye Contact:Good  Speech:Clear and Coherent; Normal Rate  Speech  Volume:Normal  Handedness:Right   Mood and Affect  Mood:Anxious  Affect:Congruent; Depressed   Thought Process  Thought Processes:Coherent; Goal Directed  Descriptions of Associations:Intact  Orientation:Full (Time, Place and Person)  Thought Content:Logical    Hallucinations:Hallucinations: None  Ideas of Reference:None  Suicidal Thoughts:Suicidal Thoughts: Yes, Passive (States he wrote a letter to boyfriend that wanted to kill self but has had a chance to calm down and doesn't want to die.  Reports thoughts were more passive than active) SI Passive Intent and/or Plan: Without Intent; Without Plan  Homicidal Thoughts:Homicidal Thoughts: No   Sensorium  Memory:No data recorded Judgment:Intact  Insight:Present   Executive Functions  Concentration:Good  Attention Span:Good  Recall:Good  Fund of Knowledge:Good  Language:Good   Psychomotor Activity  Psychomotor Activity:No data recorded  Assets  Assets:Communication Skills; Desire for Improvement; Financial Resources/Insurance; Housing; Leisure Time; Physical Health; Resilience; Social Support   Sleep  Sleep:Sleep: Good   Nutritional Assessment (For OBS and [facility]BC admissions only) Has the patient had a weight loss or gain of 10 pounds or more in the last 3 months?: No Has the patient had a decrease in food intake/or appetite?: No Does the patient have dental problems?: No Does the patient have eating habits or behaviors that may be indicators of an eating disorder including binging or inducing vomiting?: No Has the patient recently lost weight without trying?: 0 Has the patient been eating poorly because of a decreased appetite?: 0 Malnutrition Screening Tool Score: 0    Physical Exam Vitals and nursing note reviewed. Exam conducted with a chaperone present.  Constitutional:      General: He is not in acute distress.    Appearance: Normal appearance. He is not ill-appearing.  Cardiovascular:      Rate and Rhythm: Normal rate.  Pulmonary:     Effort: Pulmonary effort is normal.  Musculoskeletal:        General: Normal range of motion.     Cervical back: Normal range of motion.  Skin:    General: Skin is warm and dry.  Neurological:     Mental Status: He is alert and oriented to person, place, and time.  Psychiatric:        Attention and Perception: Attention and perception normal. He does not perceive auditory or visual hallucinations.        Mood and Affect: Affect normal. Mood is anxious.        Speech: Speech normal.        Behavior: Behavior normal. Behavior is cooperative.        Thought Content: Thought content is not paranoid or delusional. Thought content does not include homicidal ideation. Suicidal: Reported passive thoughts but denies at this time.       Cognition and Memory: Cognition and memory normal.        Judgment: Judgment normal.   Review of Systems  Constitutional: Negative.   HENT: Negative.    Eyes: Negative.   Respiratory: Negative.    Cardiovascular: Negative.   Gastrointestinal: Negative.   Genitourinary: Negative.   Musculoskeletal: Negative.   Skin: Negative.  Neurological: Negative.   Endo/Heme/Allergies: Negative.   Psychiatric/Behavioral:  Negative for hallucinations and substance abuse. Depression: Stable. Suicidal ideas: Denies at this time.  States earlier today was having passive thoughts and wrote a letter to his boyfriend.The patient is nervous/anxious (Just worrying about a lot of stuff). The patient does not have insomnia.    Blood pressure 119/84, pulse 87, temperature 99.4 F (37.4 C), temperature source Oral, resp. rate 18, SpO2 100 %. There is no height or weight on file to calculate BMI.  Past Psychiatric History: Denies prior history   Is the patient at risk to self?  Denies Has the patient been a risk to self in the past 6 months? No .    Has the patient been a risk to self within the distant past? No   Is the patient a  risk to others? No   Has the patient been a risk to others in the past 6 months? No   Has the patient been a risk to others within the distant past? No   Past Medical History:  Past Medical History:  Diagnosis Date   Asthma    No past surgical history on file.  Family History:  Family History  Problem Relation Age of Onset   Hypertension Mother     Social History:  Social History   Socioeconomic History   Marital status: Single    Spouse name: Not on file   Number of children: Not on file   Years of education: Not on file   Highest education level: Not on file  Occupational History   Not on file  Tobacco Use   Smoking status: Every Day    Packs/day: 0.25    Years: 2.00    Pack years: 0.50    Types: Cigarettes   Smokeless tobacco: Never  Substance and Sexual Activity   Alcohol use: Not Currently   Drug use: Yes    Types: Marijuana   Sexual activity: Yes  Other Topics Concern   Not on file  Social History Narrative   Not on file   Social Determinants of Health   Financial Resource Strain: Not on file  Food Insecurity: Not on file  Transportation Needs: Not on file  Physical Activity: Not on file  Stress: Not on file  Social Connections: Not on file  Intimate Partner Violence: Not on file    SDOH:  SDOH Screenings   Alcohol Screen: Not on file  Depression (PHQ2-9): Low Risk    PHQ-2 Score: 4  Financial Resource Strain: Not on file  Food Insecurity: Not on file  Housing: Not on file  Physical Activity: Not on file  Social Connections: Not on file  Stress: Not on file  Tobacco Use: High Risk   Smoking Tobacco Use: Every Day   Smokeless Tobacco Use: Never   Passive Exposure: Not on file  Transportation Needs: Not on file    Last Labs:  No visits with results within 6 Month(s) from this visit.  Latest known visit with results is:  Admission on 07/07/2020, Discharged on 07/07/2020  Component Date Value Ref Range Status   SARS Coronavirus 2  07/07/2020 POSITIVE (A)  NEGATIVE Final   Comment: RESULT CALLED TO, READ BACK BY AND VERIFIED WITH: SMITH,J. RN @2040  07/07/20 BILLINGSLEY,L (NOTE) SARS-CoV-2 target nucleic acids are DETECTED  SARS-CoV-2 RNA is generally detectable in upper respiratory specimens  during the acute phase of infection.  Positive results are indicative  of the presence of the identified virus,  but do not rule out bacterial infection or co-infection with other pathogens not detected by the test.  Clinical correlation with patient history and  other diagnostic information is necessary to determine patient infection status.  The expected result is negative.  Fact Sheet for Patients:   StrictlyIdeas.no   Fact Sheet for Healthcare Providers:   BankingDealers.co.za    This test is not yet approved or cleared by the [place] FDA and  has been authorized for detection and/or diagnosis of SARS-CoV-2 by FDA under an Emergency Use Authorization (EUA).  This EUA will remain in effect (meaning th                          is test can be used) for the duration of  the COVID-19 declaration under Section 564(b)(1) of the Act, 21 U.S.C. section 360-bbb-3(b)(1), unless the authorization is terminated or revoked sooner.  Performed at [facility], [place] [address] [place], [place] [address]     Allergies: Patient has no known allergies.  PTA Medications: (Not in a hospital admission)   Medical Decision Making  Fallou Bahl was admitted to [facility]  continuous assessment unit for Adjustment disorder with mixed anxiety and depressed mood, crisis management, and stabilization. Routine labs ordered, which include  Lab Orders         Resp Panel by RT-PCR (Flu A&B, Covid) Anterior Nasal Swab         CBC with Differential/Platelet         Comprehensive metabolic panel         Hemoglobin A1c         Magnesium          Ethanol         Lipid panel         TSH         Urinalysis, Routine w reflex microscopic Urine, Clean Catch         POCT Urine Drug Screen - (I-Screen)    Medication Management: Medications started Meds ordered this encounter  Medications   acetaminophen (TYLENOL) tablet 650 mg   alum & mag hydroxide-simeth (MAALOX/MYLANTA) 200-200-20 MG/5ML suspension 30 mL   magnesium hydroxide (MILK OF MAGNESIA) suspension 30 mL   hydrOXYzine (ATARAX) tablet 25 mg   traZODone (DESYREL) tablet 50 mg    Will maintain continuous observation for safety.    Recommendations  Based on my evaluation the patient does not appear to have an emergency medical condition.  Appropriate for discharge tomorrow if continue to be non-suicidal.  Resources have been added to AVS  Liandro Thelin, NP 03/27/22  7:18 PM

## 2022-03-27 NOTE — ED Notes (Signed)
Pt A&O x 4, presents with passive suicidal ideations after heated argument with boyfriend.  Denies HI or AVH.  Pt tearful, cooperative, pleasant.  Pt contracts for safety.  Monitoring for safety.

## 2022-03-28 ENCOUNTER — Encounter (HOSPITAL_COMMUNITY): Payer: Self-pay | Admitting: Emergency Medicine

## 2022-03-28 NOTE — ED Notes (Signed)
Pt sleeping at present, no distress noted.  Monitoring for safety. 

## 2022-03-28 NOTE — Progress Notes (Signed)
RN received report from Nurse. Patient currently resting with unlabored respirations, no acute signs of distress. Staff will continue to monitor.

## 2022-03-28 NOTE — ED Notes (Signed)
Patient discharged from [facility] observation to home with community resources to follow up with. Patient denies, SI, HI and AVH at time of discharge and pain score of 0/10. Patient received all belongings from [facility] locker. Patient left with sister in private vehicle.

## 2022-03-28 NOTE — ED Provider Notes (Signed)
[facility]/OBS ASAP Discharge Summary  Date and Time: 03/28/2022 9:24 AM  Name: Gabriel Campbell  MRN:  [id number]   Discharge Diagnoses:  Final diagnoses:  Adjustment disorder with mixed anxiety and depressed mood  Passive suicidal ideations   Subjective:   Pt assessed face to face by nurse practitioner today.  Pt reports experiencing passive SI yesterday w/o plan or intent. States he wrote a letter to his now ex-boyfriend indicating such. States trigger for experiencing passive SI yesterday was break up with his ex-boyfriend. He denies SI/VI/HI, plan or intent. He denies AVH, paranoia. He denies hx of NSSI or SA. He denies hx of inpatient psychiatric hospitalization. He denies prior psychiatric dx. He is not currently connected w/ medication management or counseling. He verbalizes readiness for discharge and verbally contracts to safety. He denies alcohol, nicotine, marijuana, crack/cocaine, other SU. He denies access to a firearm. He is currently unemployed, is looking for work in a [employer]. He reports on discharge he will be living w/ his sister at [address] in [place], [place]. Pt's labs were reviewed w/ him. Discussed abnml results and recommended follow up w/ PCP. Pt states he does have a PCP, last saw PCP last month; he agrees to follow up w/ PCP. Pt gave verbal consent to speak w/ his sister Gabriel Campbell at [phone]. Discussed w/ pt recommendation to follow up with outpatient medication management and counseling. Pt agrees to do the same. Safety planning completed, and pt agrees to call 911/EMS or go to the nearest emergency room for worsening condition.  Collateral w/ Gabriel Campbell at [phone]. Gabriel Campbell denies safety concerns w/ pt's discharge to her home today. She states she can be a support for pt and maintain safety upon discharge. Gabriel Campbell will be picking pt up from this facility.   Pt is a&ox3, in no acute distress, non-toxic appearing. He appears appropriate for environment, fairly  groomed. Eye contact is good. Speech is clear and coherent, w/ nml rate and volume. Reported mood is euthymic. Affect is blunt. TP is coherent, goal directed, linear. Description of associations is intact. TC is logical. There is no evidence of responding to internal stimuli, agitation, aggression or distractibility. No delusions elicited. Pt is calm, cooperative, and pleasant.   H&P from 03/27/22 HPI: History of Present illness: Gabriel Campbell is a 26 y.o. male patient presented to [facility] as a walk in via [place] [employer] with complaints of passive suicidal ideation   Gabriel Campbell, 26 y.o., male patient seen face to face by this provider, consulted with Dr. Earlene Plater; and chart reviewed on 03/27/22.  On evaluation Gabriel Campbell reports he was brought in by the police.  States he wrote a letter to his boyfriend today "telling him I wanted to kill myself."  Reports no prior suicide attempt or self harm.  States he has been under a lot of stress just "worrying about everything.  Relationship, unemployment, just everything, life."  Patient states he had left walking to cool down when the police picked him up.  States that he is no longer having any suicidal thoughts and the thoughts were more massive "Nobody's gonna miss me if I'm gone or not caring if I don't wake up.  I don't want to kill myself.  I've had a chance to calm down and I'm feeling better now."  Patient denies prior psychiatric hospitalization.  States he has never had outpatient psychiatric service or took psychotropic medications.  Patient states that he is currently living with his sister and  receiving unemployment.  Reports that his primary support person is his boyfriend and gave permission to speak to his boyfriend or sister:  Boyfriend Gabriel Campbell at [phone] and sister Gabriel Campbell at [phone] During evaluation Gabriel Campbell is sitting upright in chair with no distress noted.  He is alert/oriented x 4; calm/cooperative; and mood  congruent with affect.  He is speaking in a clear tone at moderate volume, and normal pace; with good eye contact.  His thought process is coherent and relevant; There is no indication that he is currently responding to internal/external stimuli or experiencing delusional thought content.  He continues to deny suicidal/self-harm/homicidal ideation, psychosis, and paranoia.   Patient has remained calm throughout assessment and has answered questions appropriately.     Collateral Information.  No answer with boyfriend.  Patient sister states that in the letter that patient "Never insinuated that he wanted to kill himself" but he did write vague statements of wanting to die.  States that her brother will be living with her.  States that she is not sure of why he wrote the letter and is aware that he has no psychiatric history, never tried to kill himself and never any outpatient or inpatient psychiatric treatment.  Wanted to know if patient could stay overnight for observation.  Informed since she said the patient was safe that he would be discharge; "But I just want him to stay overnight to be sure" and asked if her brother could call her to agree to stay overnight.    Stay Summary:  Pt is a 26 y/o male w/ presenting to [facility] on 03/27/22 w/ passive SI, w/o plan or intent. On reassessment today, he denies SI/VI/HI, plan or intent. He verbally contracts to safety. Collateral from his sister, Gabriel Campbell, denies safety concerns w/ discharge. Pt discharged.  Total Time spent with patient: 20 minutes  Past Psychiatric History: Pt denies hx of NSSI or SA. He denies hx of inpatient psychiatric hospitalization. He denies prior psychiatric dx.  Past Medical History:  Past Medical History:  Diagnosis Date   Asthma    History reviewed. No pertinent surgical history. Family History:  Family History  Problem Relation Age of Onset   Hypertension Mother    Family Psychiatric History: Pt denies family psychiatric  hx Social History:  Social History   Substance and Sexual Activity  Alcohol Use Not Currently     Social History   Substance and Sexual Activity  Drug Use Yes   Types: Marijuana    Social History   Socioeconomic History   Marital status: Single    Spouse name: Not on file   Number of children: Not on file   Years of education: Not on file   Highest education level: Not on file  Occupational History   Not on file  Tobacco Use   Smoking status: Every Day    Packs/day: 0.25    Years: 2.00    Pack years: 0.50    Types: Cigarettes   Smokeless tobacco: Never  Substance and Sexual Activity   Alcohol use: Not Currently   Drug use: Yes    Types: Marijuana   Sexual activity: Yes  Other Topics Concern   Not on file  Social History Narrative   Not on file   Social Determinants of Health   Financial Resource Strain: Not on file  Food Insecurity: Not on file  Transportation Needs: Not on file  Physical Activity: Not on file  Stress: Not on file  Social Connections:  Not on file   SDOH:  SDOH Screenings   Alcohol Screen: Not on file  Depression (PHQ2-9): Low Risk    PHQ-2 Score: 4  Financial Resource Strain: Not on file  Food Insecurity: Not on file  Housing: Not on file  Physical Activity: Not on file  Social Connections: Not on file  Stress: Not on file  Tobacco Use: High Risk   Smoking Tobacco Use: Every Day   Smokeless Tobacco Use: Never   Passive Exposure: Not on file  Transportation Needs: Not on file    Tobacco Cessation:  N/A, patient does not currently use tobacco products  Current Medications:  Current Facility-Administered Medications  Medication Dose Route Frequency Provider Last Rate Last Admin   acetaminophen (TYLENOL) tablet 650 mg  650 mg Oral Q6H PRN Rankin, Shuvon B, NP       alum & mag hydroxide-simeth (MAALOX/MYLANTA) 200-200-20 MG/5ML suspension 30 mL  30 mL Oral Q4H PRN Rankin, Shuvon B, NP       hydrOXYzine (ATARAX) tablet 25 mg  25 mg  Oral TID PRN Rankin, Shuvon B, NP   25 mg at 03/27/22 2116   magnesium hydroxide (MILK OF MAGNESIA) suspension 30 mL  30 mL Oral Daily PRN Rankin, Shuvon B, NP       traZODone (DESYREL) tablet 50 mg  50 mg Oral QHS PRN Rankin, Shuvon B, NP   50 mg at 03/27/22 2116   Current Outpatient Medications  Medication Sig Dispense Refill   albuterol (VENTOLIN HFA) 108 (90 Base) MCG/ACT inhaler Inhale into the lungs every 6 (six) hours as needed for wheezing or shortness of breath.     chlorhexidine (PERIDEX) 0.12 % solution Use as directed 15 mLs in the mouth or throat 2 (two) times daily. 473 mL 0   cyclobenzaprine (FLEXERIL) 10 MG tablet Take 1 tablet (10 mg total) by mouth 3 (three) times daily as needed for muscle spasms. Do not drink alcohol or drive while taking this medication.  May cause drowsiness. 15 tablet 0   naproxen (NAPROSYN) 500 MG tablet Take 1 tablet (500 mg total) by mouth 2 (two) times daily. 30 tablet 0    PTA Medications: (Not in a hospital admission)      03/27/2022    7:16 PM  Depression screen PHQ 2/9  Decreased Interest 1  Down, Depressed, Hopeless 1  PHQ - 2 Score 2  Altered sleeping 0  Tired, decreased energy 0  Change in appetite 0  Feeling bad or failure about yourself  1  Trouble concentrating 0  Moving slowly or fidgety/restless 0  Suicidal thoughts 1  PHQ-9 Score 4  Difficult doing work/chores Not difficult at all    Flowsheet Row ED from 03/27/2022 in [facility] ED from 01/25/2022 in [facility] from 08/23/2021 in [facility] RISK CATEGORY High Risk No Risk No Risk       Musculoskeletal  Strength & Muscle Tone: within normal limits Gait & Station: normal Patient leans: N/A  Psychiatric Specialty Exam  Presentation  General Appearance: Appropriate for Environment; Fairly Groomed  Eye Contact:Good  Speech:Clear and Coherent; Normal Rate  Speech  Volume:Normal  Handedness:Right   Mood and Affect  Mood:Euthymic  Affect:Blunt   Thought Process  Thought Processes:Coherent; Goal Directed; Linear  Descriptions of Associations:Intact  Orientation:Full (Time, Place and Person)  Thought Content:Logical     Hallucinations:Hallucinations: None  Ideas of Reference:None  Suicidal Thoughts:Suicidal Thoughts: No  Homicidal Thoughts:Homicidal Thoughts: No   Sensorium  Memory:Immediate Good; Recent Good  Judgment:Intact  Insight:Present   Executive Functions  Concentration:Good  Attention Span:Good  Recall:Good  Fund of Knowledge:Good  Language:Good   Psychomotor Activity  Psychomotor Activity:Psychomotor Activity: Normal   Assets  Assets:Communication Skills; Desire for Improvement; Financial Resources/Insurance; Housing; Social Support   Sleep  Sleep:Sleep: Good   Nutritional Assessment (For OBS and FBC admissions only) Has the patient had a weight loss or gain of 10 pounds or more in the last 3 months?: No Has the patient had a decrease in food intake/or appetite?: No Does the patient have dental problems?: No Does the patient have eating habits or behaviors that may be indicators of an eating disorder including binging or inducing vomiting?: No Has the patient recently lost weight without trying?: 0 Has the patient been eating poorly because of a decreased appetite?: 0 Malnutrition Screening Tool Score: 0   Physical Exam  Physical Exam Cardiovascular:     Rate and Rhythm: Normal rate.  Pulmonary:     Effort: Pulmonary effort is normal.  Neurological:     Mental Status: He is alert and oriented to person, place, and time.  Psychiatric:        Attention and Perception: Attention and perception normal.        Mood and Affect: Mood normal. Affect is blunt.        Speech: Speech normal.        Behavior: Behavior normal. Behavior is cooperative.        Thought Content: Thought content normal.    Review of Systems  Constitutional:  Negative for chills and fever.  Respiratory:  Negative for shortness of breath.   Cardiovascular:  Negative for chest pain and palpitations.  Gastrointestinal:  Negative for abdominal pain.  Psychiatric/Behavioral: Negative.  Negative for depression, hallucinations, memory loss, substance abuse and suicidal ideas. The patient is not nervous/anxious and does not have insomnia.   Blood pressure 110/63, pulse 72, temperature 97.9 F (36.6 C), temperature source Oral, resp. rate 18, SpO2 100 %. There is no height or weight on file to calculate BMI.  Demographic Factors:  Male, Cardell PeachGay, lesbian, or bisexual orientation, and Unemployed  Loss Factors: Loss of significant relationship  Historical Factors: NA  Risk Reduction Factors:   Living with another person, especially a relative and Positive social support  Continued Clinical Symptoms:  N/A  Cognitive Features That Contribute To Risk:  None    Suicide Risk:  Minimal: No identifiable suicidal ideation.  Patients presenting with no risk factors but with morbid ruminations; may be classified as minimal risk based on the severity of the depressive symptoms  Plan Of Care/Follow-up recommendations:  Follow up with outpatient medication management and counseling. Follow up with your PCP  Disposition:  Discharge  Gabriel ChandlerJacqueline Eun Grabiela Wohlford, NP 03/28/2022, 9:24 AM

## 2022-03-28 NOTE — ED Notes (Signed)
Pt sleeping at present, no distress noted. Respirations even & unlabored.  Monitoring for safety. 

## 2023-01-11 ENCOUNTER — Telehealth: Payer: Self-pay

## 2023-01-11 NOTE — Telephone Encounter (Signed)
Mychart msg sent

## 2023-01-25 IMAGING — CR DG RIBS W/ CHEST 3+V*L*
5 series · 5 of 5 positions shown · non-contrast
Comparison: None.

CLINICAL DATA: Injury.  Left chest wall pain

EXAM:
LEFT RIBS AND CHEST - 3+ VIEW

[w chest pa]
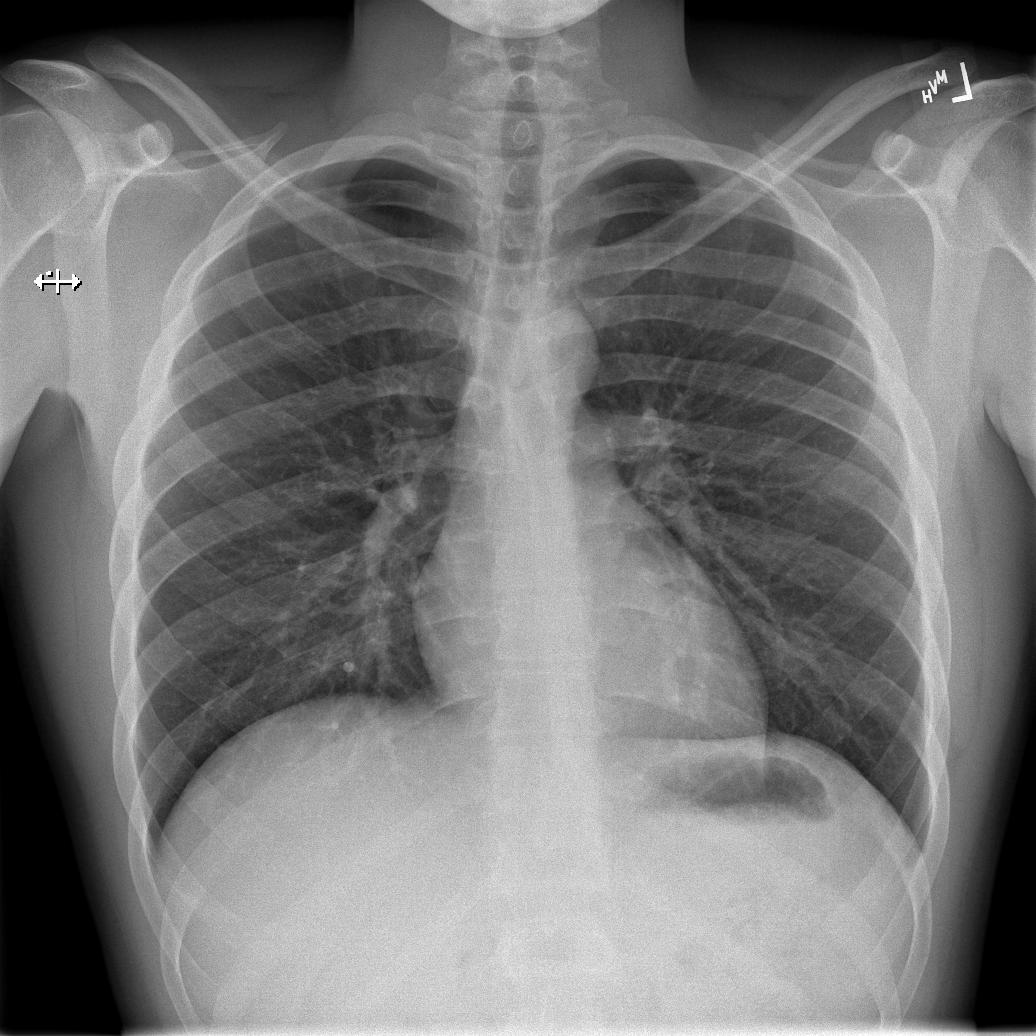

[w ribs ap upper left]
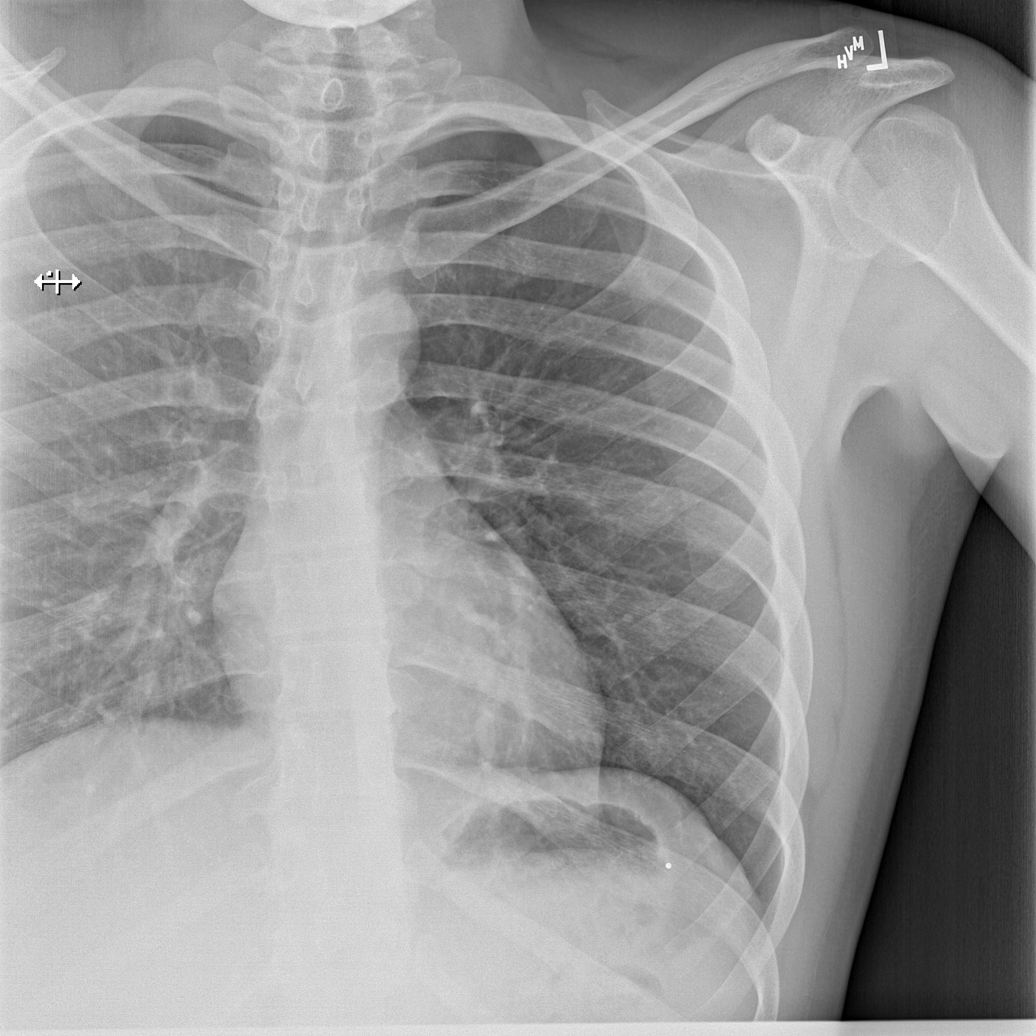

[w ribs ap lower left]
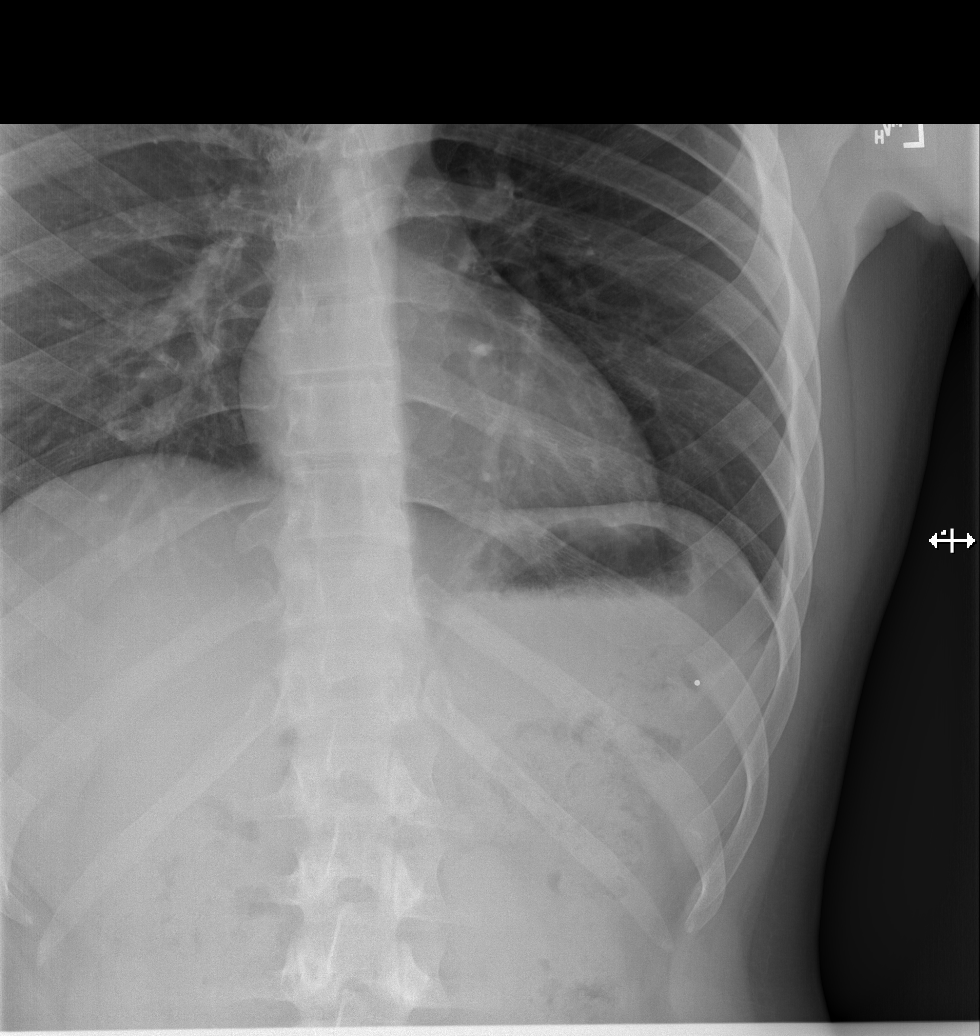

[w ribs obl left (1 of 2)]
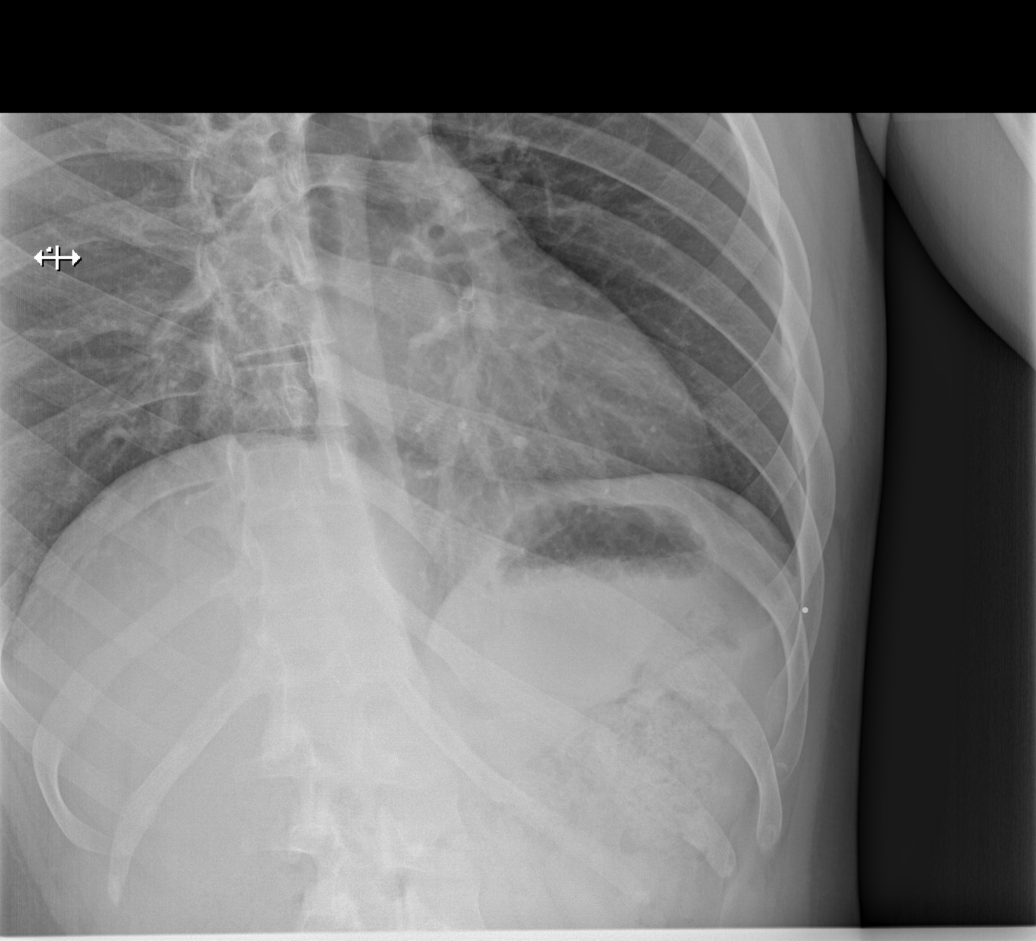

[w ribs obl left (2 of 2)]
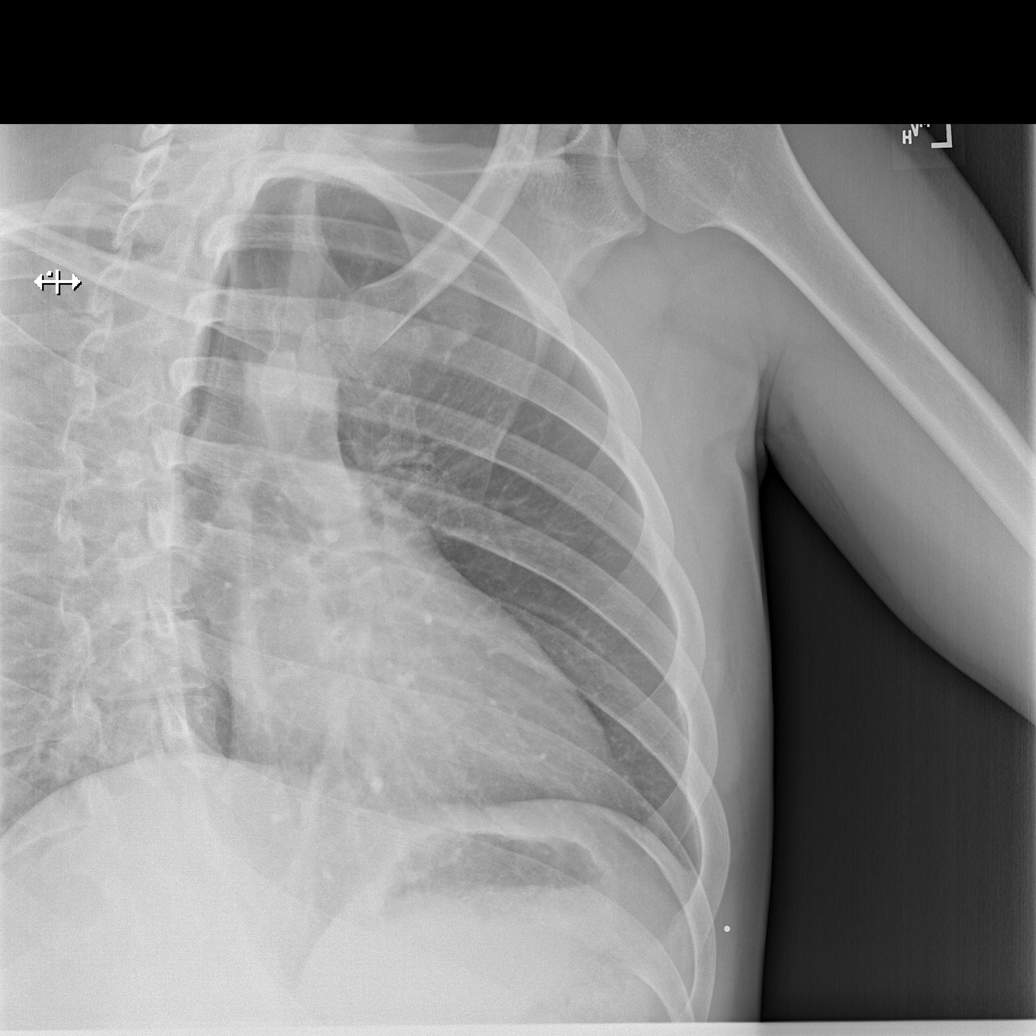

[5 of 5 positions shown; findings below may reference images not displayed]

FINDINGS: No fracture or other bone lesions are seen involving the ribs. There
is no evidence of pneumothorax or pleural effusion. Both lungs are
clear. Heart size and mediastinal contours are within normal limits.
IMPRESSION: Negative.

## 2023-04-23 ENCOUNTER — Emergency Department (HOSPITAL_COMMUNITY)
Admission: EM | Admit: 2023-04-23 | Discharge: 2023-04-24 | Discharge status: Against Medical Advice | Payer: Medicaid Other | Attending: Emergency Medicine | Admitting: Emergency Medicine

## 2023-04-23 DIAGNOSIS — S0121XA Laceration without foreign body of nose, initial encounter: Secondary | ICD-10-CM | POA: Diagnosis not present

## 2023-04-23 DIAGNOSIS — X58XXXA Exposure to other specified factors, initial encounter: Secondary | ICD-10-CM | POA: Insufficient documentation

## 2023-04-23 DIAGNOSIS — Z5321 Procedure and treatment not carried out due to patient leaving prior to being seen by health care provider: Secondary | ICD-10-CM | POA: Insufficient documentation

## 2023-04-24 ENCOUNTER — Other Ambulatory Visit: Payer: Self-pay

## 2023-04-24 ENCOUNTER — Encounter (HOSPITAL_COMMUNITY): Payer: Self-pay

## 2023-04-24 NOTE — ED Triage Notes (Signed)
Pt arrived from home via [facility] c/o laceration to right side bridge of nose approx 1 inch in length.
